# Patient Record
Sex: Female | Born: 1965 | Race: Black or African American | Hispanic: No | Smoking: Never smoker
Health system: Southern US, Community
[De-identification: ages and names within clinical notes are randomized; demographics above are authoritative.]

## PROBLEM LIST (undated history)

## (undated) DIAGNOSIS — C801 Malignant (primary) neoplasm, unspecified: Secondary | ICD-10-CM

## (undated) DIAGNOSIS — I10 Essential (primary) hypertension: Secondary | ICD-10-CM

## (undated) HISTORY — PX: COLOSTOMY: SHX63

---

## 2018-01-03 ENCOUNTER — Emergency Department (HOSPITAL_BASED_OUTPATIENT_CLINIC_OR_DEPARTMENT_OTHER)
Admission: EM | Admit: 2018-01-03 | Discharge: 2018-01-03 | Disposition: A | Discharge status: Home / Self Care | Payer: Self-pay | Attending: Emergency Medicine | Admitting: Emergency Medicine

## 2018-01-03 ENCOUNTER — Other Ambulatory Visit: Payer: Self-pay

## 2018-01-03 ENCOUNTER — Encounter (HOSPITAL_BASED_OUTPATIENT_CLINIC_OR_DEPARTMENT_OTHER): Payer: Self-pay

## 2018-01-03 DIAGNOSIS — I10 Essential (primary) hypertension: Secondary | ICD-10-CM | POA: Insufficient documentation

## 2018-01-03 DIAGNOSIS — R55 Syncope and collapse: Secondary | ICD-10-CM | POA: Insufficient documentation

## 2018-01-03 HISTORY — DX: Essential (primary) hypertension: I10

## 2018-01-03 LAB — CBC WITH DIFFERENTIAL/PLATELET
BASOS ABS: 0 10*3/uL (ref 0.0–0.1)
Basophils Relative: 1 %
Eosinophils Absolute: 0.4 10*3/uL (ref 0.0–0.7)
Eosinophils Relative: 6 %
HCT: 38.4 % (ref 36.0–46.0)
HEMOGLOBIN: 12.6 g/dL (ref 12.0–15.0)
LYMPHS ABS: 2.4 10*3/uL (ref 0.7–4.0)
LYMPHS PCT: 34 %
MCH: 27.3 pg (ref 26.0–34.0)
MCHC: 32.8 g/dL (ref 30.0–36.0)
MCV: 83.3 fL (ref 78.0–100.0)
Monocytes Absolute: 0.7 10*3/uL (ref 0.1–1.0)
Monocytes Relative: 11 %
NEUTROS PCT: 48 %
Neutro Abs: 3.4 10*3/uL (ref 1.7–7.7)
Platelets: 259 10*3/uL (ref 150–400)
RBC: 4.61 MIL/uL (ref 3.87–5.11)
RDW: 14.4 % (ref 11.5–15.5)
WBC: 7 10*3/uL (ref 4.0–10.5)

## 2018-01-03 LAB — URINALYSIS, ROUTINE W REFLEX MICROSCOPIC
BILIRUBIN URINE: NEGATIVE
GLUCOSE, UA: NEGATIVE mg/dL
HGB URINE DIPSTICK: NEGATIVE
Ketones, ur: NEGATIVE mg/dL
LEUKOCYTES UA: NEGATIVE
Nitrite: NEGATIVE
PH: 7 (ref 5.0–8.0)
Protein, ur: NEGATIVE mg/dL

## 2018-01-03 LAB — BASIC METABOLIC PANEL
ANION GAP: 10 (ref 5–15)
BUN: 13 mg/dL (ref 6–20)
CHLORIDE: 106 mmol/L (ref 98–111)
CO2: 25 mmol/L (ref 22–32)
Calcium: 9.3 mg/dL (ref 8.9–10.3)
Creatinine, Ser: 0.77 mg/dL (ref 0.44–1.00)
GFR calc Af Amer: >60 mL/min (ref >60)
GFR calc non Af Amer: >60 mL/min (ref >60)
GLUCOSE: 121 mg/dL — AB (ref 70–99)
Potassium: 3.5 mmol/L (ref 3.5–5.1)
SODIUM: 141 mmol/L (ref 135–145)

## 2018-01-03 LAB — PREGNANCY, URINE: Preg Test, Ur: NEGATIVE

## 2018-01-03 MED ORDER — SODIUM CHLORIDE 0.9 % IV BOLUS
1000.0000 mL | Freq: Once | INTRAVENOUS | Status: AC
  Administered 2018-01-03: 1000 mL via INTRAVENOUS

## 2018-01-03 MED ORDER — HYDROCHLOROTHIAZIDE 25 MG PO TABS: 25.0000 mg | ORAL_TABLET | Freq: Every day | ORAL | 0 refills | Status: AC

## 2018-01-03 NOTE — ED Provider Notes (Signed)
[facility] EMERGENCY DEPARTMENT Provider Note   CSN: [id number] Arrival date & time: 01/03/18  1305     History   Chief Complaint Chief Complaint  Patient presents with  . Near Syncope    HPI Jessica Conway is a 52 y.o. female.  52 yo F with a chief complaint of syncopal event.  The patient has been constipated and tried to have a bowel movement and then stood up and felt lightheaded.  She then collapsed in her house.  Family then decided to bring her here.  She had another event here when she tried to stand up to sit in the triage chair.  She denies chest pain or shortness of breath denies headache or neck pain she denies vomiting or diarrhea.  Denies decreased oral intake.  She denies dark stool or blood in her stool.  Denies abdominal pain.  Denies dysuria increased frequency or hesitancy.  The history is provided by the patient.  Near Syncope  This is a new problem. The current episode started less than 1 hour ago. The problem occurs rarely. The problem has been resolved. Pertinent negatives include no chest pain, no abdominal pain, no headaches and no shortness of breath. Nothing aggravates the symptoms. Nothing relieves the symptoms. She has tried nothing for the symptoms. The treatment provided no relief.    Past Medical History:  Diagnosis Date  . Hypertension     There are no active problems to display for this patient.   Past Surgical History:  Procedure Laterality Date  . CESAREAN SECTION       OB History   None      Home Medications    Prior to Admission medications   Medication Sig Start Date End Date Taking? Authorizing Provider  hydrochlorothiazide (HYDRODIURIL) 25 MG tablet Take 1 tablet (25 mg total) by mouth daily. 01/03/18   Deno Etienne, DO    Family History No family history on file.  Social History Social History   Tobacco Use  . Smoking status: Never Smoker  . Smokeless tobacco: Never Used  Substance Use Topics  . Alcohol  use: Never    Frequency: Never  . Drug use: Never     Allergies   Patient has no known allergies.   Review of Systems Review of Systems  Constitutional: Negative for chills and fever.  HENT: Negative for congestion and rhinorrhea.   Eyes: Negative for redness and visual disturbance.  Respiratory: Negative for shortness of breath and wheezing.   Cardiovascular: Positive for near-syncope. Negative for chest pain and palpitations.  Gastrointestinal: Negative for abdominal pain, nausea and vomiting.  Genitourinary: Negative for dysuria and urgency.  Musculoskeletal: Negative for arthralgias and myalgias.  Skin: Negative for pallor and wound.  Neurological: Negative for dizziness and headaches.     Physical Exam Updated Vital Signs BP (!) 176/107 (BP Location: Right Arm) Comment: out of meds x 1 month  Pulse (!) 110   Temp 98.5 F (36.9 C) (Oral)   Resp 20   SpO2 100%   Physical Exam  Constitutional: She is oriented to person, place, and time. She appears well-developed and well-nourished. No distress.  HENT:  Head: Normocephalic and atraumatic.  Eyes: Pupils are equal, round, and reactive to light. EOM are normal.  Neck: Normal range of motion. Neck supple.  Cardiovascular: Normal rate and regular rhythm. Exam reveals no gallop and no friction rub.  No murmur heard. Pulmonary/Chest: Effort normal. She has no wheezes. She has no rales.  Abdominal:  Soft. She exhibits no distension. There is no tenderness.  Musculoskeletal: She exhibits no edema or tenderness.  Neurological: She is alert and oriented to person, place, and time. She has normal strength. No cranial nerve deficit or sensory deficit. She displays a negative Romberg sign. Coordination and gait normal. GCS eye subscore is 4. GCS verbal subscore is 5. GCS motor subscore is 6.  Skin: Skin is warm and dry. She is not diaphoretic.  Psychiatric: She has a normal mood and affect. Her behavior is normal.  Nursing note and  vitals reviewed.    ED Treatments / Results  Labs (all labs ordered are listed, but only abnormal results are displayed) Labs Reviewed  BASIC METABOLIC PANEL - Abnormal; Notable for the following components:      Result Value   Glucose, Bld 121 (*)    All other components within normal limits  URINALYSIS, ROUTINE W REFLEX MICROSCOPIC - Abnormal; Notable for the following components:   Color, Urine STRAW (*)    Specific Gravity, Urine <1.005 (*)    All other components within normal limits  CBC WITH DIFFERENTIAL/PLATELET  PREGNANCY, URINE    EKG EKG Interpretation  Date/Time:  Monday January 03 2018 13:09:59 EDT Ventricular Rate:  110 PR Interval:  158 QRS Duration: 98 QT Interval:  338 QTC Calculation: 457 R Axis:   -39 Text Interpretation:  Sinus tachycardia Left axis deviation Left ventricular hypertrophy with repolarization abnormality Abnormal ECG no wpw, prolonged qt or brugada No significant change since last tracing Confirmed by Deno Etienne [phone]) on 01/03/2018 1:42:12 PM   Radiology No results found.  Procedures Procedures (including critical care time)  Medications Ordered in ED Medications  sodium chloride 0.9 % bolus 1,000 mL (0 mLs Intravenous Stopped 01/03/18 1435)     Initial Impression / Assessment and Plan / ED Course  I have reviewed the triage vital signs and the nursing notes.  Pertinent labs & imaging results that were available during my care of the patient were reviewed by me and considered in my medical decision making (see chart for details).     52 yo F with a cc of syncope.  This occurred twice a day.  She is denying any other symptoms.  She is tachycardic on arrival here.  We will give a fluid bolus check lab work.  We will then reassess.  She feels better laying in the bed.  I wonder if this is orthostatic.  It sounded vasovagal based on her history of trying to have a bowel movement and then the initial event.  I am not sure why she had a  recurrent event here.  Lab work is unremarkable the patient is not anemic she is not pregnant she has no concerning electrolyte abnormality.  She feels better and continues to feel better.  She is able to ambulate without difficulty.  Discharge home.  3:17 PM:  I have discussed the diagnosis/risks/treatment options with the patient and family and believe the pt to be eligible for discharge home to follow-up with PCP. We also discussed returning to the ED immediately if new or worsening sx occur. We discussed the sx which are most concerning (e.g., sudden worsening pain, fever, inability to tolerate by mouth) that necessitate immediate return. Medications administered to the patient during their visit and any new prescriptions provided to the patient are listed below.  Medications given during this visit Medications  sodium chloride 0.9 % bolus 1,000 mL (0 mLs Intravenous Stopped 01/03/18 1435)  The patient appears reasonably screen and/or stabilized for discharge and I doubt any other medical condition or other [facility] requiring further screening, evaluation, or treatment in the ED at this time prior to discharge.    Final Clinical Impressions(s) / ED Diagnoses   Final diagnoses:  Syncope and collapse    ED Discharge Orders         Ordered    hydrochlorothiazide (HYDRODIURIL) 25 MG tablet  Daily     01/03/18 Auberry, Tonae Livolsi, DO 01/03/18 1517

## 2018-01-03 NOTE — Discharge Instructions (Signed)
Eat and drink well for the next couple of days.  Return for chest pain, shortness of breath, abdominal pain, vomiting, repeat event. Follow up with your doctor.

## 2018-01-03 NOTE — ED Triage Notes (Signed)
Was called to ED lobby by reg clerk-arrived to find pt lying in her right side-A/O-was assisted to sitting then standing and to w/c-pt states she had a near syncopal episode at home earlier-denies LOC-states "I just didn't feel good and felt like I was gonna go down"-denies pain-states she was concerned her BP elevated due to out of meds x 1 month

## 2019-02-26 ENCOUNTER — Encounter (HOSPITAL_BASED_OUTPATIENT_CLINIC_OR_DEPARTMENT_OTHER): Payer: Self-pay | Admitting: Emergency Medicine

## 2019-02-26 ENCOUNTER — Emergency Department (HOSPITAL_BASED_OUTPATIENT_CLINIC_OR_DEPARTMENT_OTHER): Payer: Self-pay

## 2019-02-26 ENCOUNTER — Other Ambulatory Visit: Payer: Self-pay

## 2019-02-26 ENCOUNTER — Emergency Department (HOSPITAL_BASED_OUTPATIENT_CLINIC_OR_DEPARTMENT_OTHER)
Admission: EM | Admit: 2019-02-26 | Discharge: 2019-02-26 | Disposition: A | Discharge status: Home / Self Care | Payer: Self-pay | Attending: Emergency Medicine | Admitting: Emergency Medicine

## 2019-02-26 DIAGNOSIS — E876 Hypokalemia: Secondary | ICD-10-CM | POA: Insufficient documentation

## 2019-02-26 DIAGNOSIS — I10 Essential (primary) hypertension: Secondary | ICD-10-CM | POA: Insufficient documentation

## 2019-02-26 DIAGNOSIS — G8918 Other acute postprocedural pain: Secondary | ICD-10-CM | POA: Insufficient documentation

## 2019-02-26 HISTORY — DX: Malignant (primary) neoplasm, unspecified: C80.1

## 2019-02-26 LAB — COMPREHENSIVE METABOLIC PANEL
ALT: 12 U/L (ref 0–44)
AST: 18 U/L (ref 15–41)
Albumin: 3.3 g/dL — ABNORMAL LOW (ref 3.5–5.0)
Alkaline Phosphatase: 64 U/L (ref 38–126)
Anion gap: 10 (ref 5–15)
BUN: 9 mg/dL (ref 6–20)
CO2: 25 mmol/L (ref 22–32)
Calcium: 9.1 mg/dL (ref 8.9–10.3)
Chloride: 102 mmol/L (ref 98–111)
Creatinine, Ser: 0.89 mg/dL (ref 0.44–1.00)
GFR calc Af Amer: >60 mL/min (ref >60)
GFR calc non Af Amer: >60 mL/min (ref >60)
Glucose, Bld: 121 mg/dL — ABNORMAL HIGH (ref 70–99)
Potassium: 3.1 mmol/L — ABNORMAL LOW (ref 3.5–5.1)
Sodium: 137 mmol/L (ref 135–145)
Total Bilirubin: 0.6 mg/dL (ref 0.3–1.2)
Total Protein: 7.4 g/dL (ref 6.5–8.1)

## 2019-02-26 LAB — CBC WITH DIFFERENTIAL/PLATELET
Abs Immature Granulocytes: 0.04 10*3/uL (ref 0.00–0.07)
Basophils Absolute: 0.1 10*3/uL (ref 0.0–0.1)
Basophils Relative: 1 %
Eosinophils Absolute: 0.1 10*3/uL (ref 0.0–0.5)
Eosinophils Relative: 2 %
HCT: 32.3 % — ABNORMAL LOW (ref 36.0–46.0)
Hemoglobin: 9.6 g/dL — ABNORMAL LOW (ref 12.0–15.0)
Immature Granulocytes: 1 %
Lymphocytes Relative: 18 %
Lymphs Abs: 1.3 10*3/uL (ref 0.7–4.0)
MCH: 25.9 pg — ABNORMAL LOW (ref 26.0–34.0)
MCHC: 29.7 g/dL — ABNORMAL LOW (ref 30.0–36.0)
MCV: 87.1 fL (ref 80.0–100.0)
Monocytes Absolute: 0.8 10*3/uL (ref 0.1–1.0)
Monocytes Relative: 11 %
Neutro Abs: 5.1 10*3/uL (ref 1.7–7.7)
Neutrophils Relative %: 67 %
Platelets: 545 10*3/uL — ABNORMAL HIGH (ref 150–400)
RBC: 3.71 MIL/uL — ABNORMAL LOW (ref 3.87–5.11)
RDW: 15.6 % — ABNORMAL HIGH (ref 11.5–15.5)
WBC: 7.5 10*3/uL (ref 4.0–10.5)
nRBC: 0 % (ref 0.0–0.2)

## 2019-02-26 LAB — LACTIC ACID, PLASMA: Lactic Acid, Venous: 1.7 mmol/L (ref 0.5–1.9)

## 2019-02-26 MED ORDER — SODIUM CHLORIDE 0.9 % IV BOLUS
1000.0000 mL | Freq: Once | INTRAVENOUS | Status: AC
  Administered 2019-02-26: 15:00:00 1000 mL via INTRAVENOUS

## 2019-02-26 MED ORDER — POTASSIUM CHLORIDE CRYS ER 20 MEQ PO TBCR: 20.0000 meq | EXTENDED_RELEASE_TABLET | Freq: Two times a day (BID) | ORAL | 0 refills | Status: AC

## 2019-02-26 MED ORDER — HYDROMORPHONE HCL 1 MG/ML IJ SOLN
1.0000 mg | Freq: Once | INTRAMUSCULAR | Status: AC
  Administered 2019-02-26: 1 mg via INTRAVENOUS
  Filled 2019-02-26: qty 1

## 2019-02-26 MED ORDER — ONDANSETRON HCL 4 MG/2ML IJ SOLN
4.0000 mg | Freq: Once | INTRAMUSCULAR | Status: AC
  Administered 2019-02-26: 4 mg via INTRAVENOUS
  Filled 2019-02-26: qty 2

## 2019-02-26 MED ORDER — IOHEXOL 300 MG/ML  SOLN
100.0000 mL | Freq: Once | INTRAMUSCULAR | Status: AC | PRN
  Administered 2019-02-26: 100 mL via INTRAVENOUS

## 2019-02-26 NOTE — ED Triage Notes (Signed)
Generalized abd pain today with nausea. Recently had rectal surgery for cancer, has colostomy in place.

## 2019-02-26 NOTE — ED Provider Notes (Signed)
[place] EMERGENCY DEPARTMENT Provider Note   CSN: [id number] Arrival date & time: 02/26/19  1422     History   Chief Complaint Chief Complaint  Patient presents with   Abdominal Pain    HPI Jessica Conway is a 53 y.o. female.     Patient with history of anorectal cancer, surgery and hospitalization for this 10/6-10/12/20 at [facility].  Patient reports continued pain in her abdomen since the surgery.  She is on pain medication for this at home.  Her pain was uncontrolled today prompting emergency department visit.  She continues to have throughput into her colostomy without any noted blood.  No fevers, chest pain, shortness of breath.  No urinary symptoms.  She reports decreased appetite and solid and fluid intake.  The onset of this condition was acute. The course is constant. Aggravating factors: none. Alleviating factors: none.       Past Medical History:  Diagnosis Date   Cancer ([place])    Hypertension     There are no active problems to display for this patient.   Past Surgical History:  Procedure Laterality Date   CESAREAN SECTION     COLOSTOMY       OB History   No obstetric history on file.      Home Medications    Prior to Admission medications   Medication Sig Start Date End Date Taking? Authorizing Provider  hydrochlorothiazide (HYDRODIURIL) 25 MG tablet Take 1 tablet (25 mg total) by mouth daily. 01/03/18   Deno Etienne, DO    Family History No family history on file.  Social History Social History   Tobacco Use   Smoking status: Never Smoker   Smokeless tobacco: Never Used  Substance Use Topics   Alcohol use: Never    Frequency: Never   Drug use: Never     Allergies   Patient has no known allergies.   Review of Systems Review of Systems  Constitutional: Negative for fever.  HENT: Negative for rhinorrhea and sore throat.   Eyes: Negative for redness.  Respiratory: Negative for cough.     Cardiovascular: Negative for chest pain.  Gastrointestinal: Positive for abdominal pain. Negative for constipation, diarrhea, nausea and vomiting.  Genitourinary: Negative for dysuria.  Musculoskeletal: Negative for myalgias.  Skin: Negative for rash.  Neurological: Negative for headaches.     Physical Exam Updated Vital Signs BP 103/71    Pulse 96    Temp 98.5 F (36.9 C) (Oral)    Resp (!) 22    Ht 5\' 2"  (1.575 m)    Wt 61.7 kg    SpO2 100%    BMI 24.87 kg/m   Physical Exam Vitals signs and nursing note reviewed.  Constitutional:      Appearance: She is well-developed.  HENT:     Head: Normocephalic and atraumatic.  Eyes:     General:        Right eye: No discharge.        Left eye: No discharge.     Conjunctiva/sclera: Conjunctivae normal.  Neck:     Musculoskeletal: Normal range of motion and neck supple.  Cardiovascular:     Rate and Rhythm: Normal rate and regular rhythm.     Heart sounds: Normal heart sounds.  Pulmonary:     Effort: Pulmonary effort is normal.     Breath sounds: Normal breath sounds.  Abdominal:     Palpations: Abdomen is soft.     Tenderness: There is generalized abdominal  tenderness. There is no right CVA tenderness, left CVA tenderness, guarding or rebound.  Genitourinary:    Comments: Patient with 2 post surgical drains in place, drainage noted in bulbs. Skin:    General: Skin is warm and dry.  Neurological:     Mental Status: She is alert.      ED Treatments / Results  Labs (all labs ordered are listed, but only abnormal results are displayed) Labs Reviewed  COMPREHENSIVE METABOLIC PANEL - Abnormal; Notable for the following components:      Result Value   Potassium 3.1 (*)    Glucose, Bld 121 (*)    Albumin 3.3 (*)    All other components within normal limits  CBC WITH DIFFERENTIAL/PLATELET - Abnormal; Notable for the following components:   RBC 3.71 (*)    Hemoglobin 9.6 (*)    HCT 32.3 (*)    MCH 25.9 (*)    MCHC 29.7 (*)     RDW 15.6 (*)    Platelets 545 (*)    All other components within normal limits  LACTIC ACID, PLASMA    EKG None  Radiology Ct Abdomen Pelvis W Contrast  Result Date: 02/26/2019 CLINICAL DATA:  Reported history of surgical management of anal cancer earlier this month with colostomy. Acute abdominal pain. EXAM: CT ABDOMEN AND PELVIS WITH CONTRAST TECHNIQUE: Multidetector CT imaging of the abdomen and pelvis was performed using the standard protocol following bolus administration of intravenous contrast. CONTRAST:  127mL OMNIPAQUE IOHEXOL 300 MG/ML  SOLN COMPARISON:  01/25/2019 PET-CT.  05/02/2018 MRI pelvis. FINDINGS: Lower chest: No acute abnormality at the lung bases. Calcified granulomatous nodes again noted in the left infrahilar and right pericardiophrenic regions. Hepatobiliary: Normal liver size. No liver mass. Cholelithiasis. No gallbladder distention. No gallbladder wall thickening. No pericholecystic fluid. No biliary ductal dilatation. Pancreas: Normal, with no mass or duct dilation. Spleen: Normal size. No mass. Adrenals/Urinary Tract: Normal adrenals. Normal kidneys with no hydronephrosis and no renal mass. Normal bladder. Stomach/Bowel: Normal non-distended stomach. No dilated small bowel loops. Suggestion of mild small bowel wall thickening within several pelvic small bowel loops. Appendix not discretely visualized. Status post interval low anterior resection with end sigmoid colostomy in the ventral left abdominal wall. Remnant large bowel is collapsed with no definite large bowel wall thickening. No colonic diverticulosis. Fat stranding and ill-defined fluid in the LAR surgical bed extending into the lower presacral space. Two surgical drains terminate within LAR surgical bed. No fluid collections or discrete masses in the LAR surgical bed. Vascular/Lymphatic: Atherosclerotic nonaneurysmal abdominal aorta. Patent portal, splenic, hepatic and renal veins. No pathologically enlarged  lymph nodes in the abdomen or pelvis. Reproductive: Normal size anteverted uterus. Tiny coarse calcification in the anterior fundal midline uterus may represent a tiny calcified degenerated fibroid. No adnexal masses. Other: No pneumoperitoneum, ascites or focal fluid collection. Musculoskeletal: No aggressive appearing focal osseous lesions. Mild thoracolumbar spondylosis. IMPRESSION: 1. Status post LAR and end colostomy. Fat stranding and ill-defined fluid in the LAR surgical bed extending into the lower presacral space, probably within expected postoperative limits, without abscess or discrete mass in the surgical bed. 2. Suggestion of mild wall thickening within several pelvic small bowel loops. If the patient has a history of pelvic radiation therapy, findings may represent post treatment change. Otherwise, mild nonspecific ileitis not excluded. No evidence of bowel obstruction. 3. No findings of metastatic disease in the abdomen or pelvis. 4. Chronic findings include: Aortic Atherosclerosis (ICD10-I70.0). Cholelithiasis. Electronically Signed   By: Rinaldo Ratel  Poff M.D.   On: 02/26/2019 16:28    Procedures Procedures (including critical care time)  Medications Ordered in ED Medications  sodium chloride 0.9 % bolus 1,000 mL (1,000 mLs Intravenous New Bag/Given 02/26/19 1459)  HYDROmorphone (DILAUDID) injection 1 mg (1 mg Intravenous Given 02/26/19 1500)  ondansetron (ZOFRAN) injection 4 mg (4 mg Intravenous Given 02/26/19 1500)  iohexol (OMNIPAQUE) 300 MG/ML solution 100 mL (100 mLs Intravenous Contrast Given 02/26/19 1542)     Initial Impression / Assessment and Plan / ED Course  I have reviewed the triage vital signs and the nursing notes.  Pertinent labs & imaging results that were available during my care of the patient were reviewed by me and considered in my medical decision making (see chart for details).        Patient seen and examined.  Will control pain, check labs.  Discussed  with Dr. Willaim Rayas who will see patient.  CT of the abdomen ordered to evaluate for any postop complications.  Normal white blood cell count noted.  Vital signs reviewed and are as follows: BP 103/71    Pulse 96    Temp 98.5 F (36.9 C) (Oral)    Resp (!) 22    Ht 5\' 2"  (1.575 m)    Wt 61.7 kg    SpO2 100%    BMI 24.87 kg/m   4:10 PM Pain controlled. Reviewed CT. Awaiting read.   5:33 PM patient updated on results.  She continues to appear much more comfortable lying in bed and states that she feels okay.  We discussed her results at bedside.  She states that she has pain medicine to use at home and has follow-up with her doctors tomorrow.  She is comfortable with discharged home.  Family member at bedside.  The patient was urged to return to the Emergency Department immediately with worsening of current symptoms, worsening abdominal pain, persistent vomiting, blood noted in stools, fever, or any other concerns. The patient verbalized understanding.    Final Clinical Impressions(s) / ED Diagnoses   Final diagnoses:  Postoperative pain  Hypokalemia   Patient with abdominal pain approximately 2 to 3 weeks postop.  Lab work is reassuring in regards to infection.  Mild hypokalemia, patient is on HCTZ.  Rx for potassium given.  CT with expected postoperative changes.  No signs of abscess or obstruction.  No obvious concerning areas for other infection which is in line with her blood work today.  Main issue seems to be pain control at this time.  Pain is well controlled in the emergency department with parenteral narcotic.  She continues to do well.  She has medicine to use at home and will continue this.  Follow-up with her doctor tomorrow.   ED Discharge Orders         Ordered    potassium chloride SA (KLOR-CON) 20 MEQ tablet  2 times daily     02/26/19 1724           Carlisle Cater, [place] 02/26/19 1735    Margette Fast, MD 02/27/19 1013

## 2019-02-26 NOTE — ED Notes (Signed)
Rectal surgical site with intact sutures no redness or exudate noted.Two JP drain insertion sites to bilateral buttock with clean, dry, intact areas. Abd pain on presentation, but now resolved after pain medication administration.

## 2019-02-26 NOTE — Discharge Instructions (Signed)
Please read and follow all provided instructions.  Your diagnoses today include:  1. Postoperative pain   2. Hypokalemia     Tests performed today include:  Blood counts and electrolytes -slightly low potassium, please have this rechecked by your doctor  Blood tests to check liver and kidney function  CT scan of your abdomen -shows inflammation and some swelling as expected after your surgery.  Fortunately, no signs of infection or other concerning complications.  Vital signs. See below for your results today.   Medications prescribed:   Potassium supplement  Take any prescribed medications only as directed.  Home care instructions:   Follow any educational materials contained in this packet.  Follow-up instructions: Please follow-up with your doctor tomorrow as planned.    Return instructions:  SEEK IMMEDIATE MEDICAL ATTENTION IF:  The pain does not go away or becomes severe   A temperature above 101F develops   Repeated vomiting occurs (multiple episodes)   The pain becomes localized to portions of the abdomen. The right side could possibly be appendicitis. In an adult, the left lower portion of the abdomen could be colitis or diverticulitis.   Blood is being passed in stools or vomit (bright red or black tarry stools)   You develop chest pain, difficulty breathing, dizziness or fainting, or become confused, poorly responsive, or inconsolable (young children)  If you have any other emergent concerns regarding your health  Additional Information: Abdominal (belly) pain can be caused by many things. Your caregiver performed an examination and possibly ordered blood/urine tests and imaging (CT scan, x-rays, ultrasound). Many cases can be observed and treated at home after initial evaluation in the emergency department. Even though you are being discharged home, abdominal pain can be unpredictable. Therefore, you need a repeated exam if your pain does not resolve,  returns, or worsens. Most patients with abdominal pain don't have to be admitted to the hospital or have surgery, but serious problems like appendicitis and gallbladder attacks can start out as nonspecific pain. Many abdominal conditions cannot be diagnosed in one visit, so follow-up evaluations are very important.  Your vital signs today were: BP 112/66    Pulse 89    Temp 98.5 F (36.9 C) (Oral)    Resp 13    Ht 5\' 2"  (1.575 m)    Wt 61.7 kg    SpO2 98%    BMI 24.87 kg/m  If your blood pressure (bp) was elevated above 135/85 this visit, please have this repeated by your doctor within one month. --------------

## 2020-08-31 IMAGING — CT CT ABD-PELV W/ CM
2 of 5 series · 15 of 46 positions shown, 17 images · IV contrast (Omnipaque)
Comparison: 01/25/2019 PET-CT.  05/02/2018 MRI pelvis.

CLINICAL DATA: Reported history of surgical management of anal
cancer earlier this month with colostomy. Acute abdominal pain.

EXAM:
CT ABDOMEN AND PELVIS WITH CONTRAST
TECHNIQUE: Multidetector CT imaging of the abdomen and pelvis was performed
using the standard protocol following bolus administration of
intravenous contrast.
CONTRAST:  100mL OMNIPAQUE IOHEXOL 300 MG/ML  SOLN

[Series 2: axial st · axial · 0.66mm/px · z∈[-304,+111]mm · 12 of 93 slices shown, 14 images]
[im 5/93  soft-tissue]
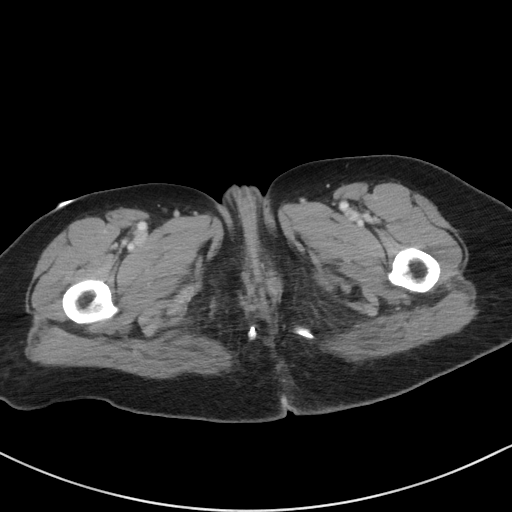
[im 5/93  bone]
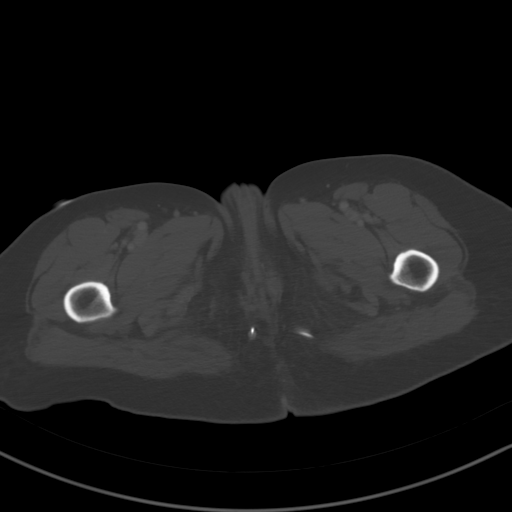
[im 14/93  soft-tissue]
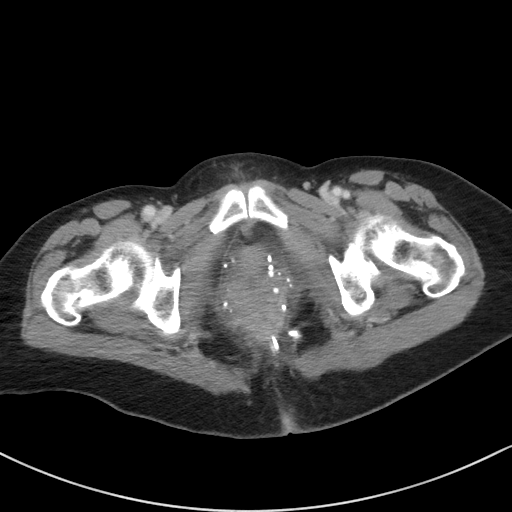
[im 19/93  soft-tissue]
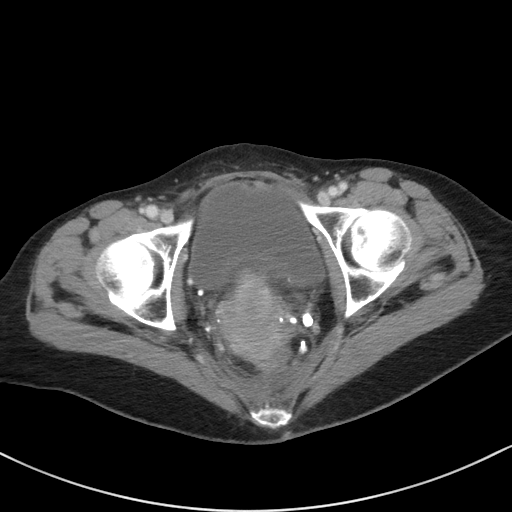
[im 28/93  soft-tissue]
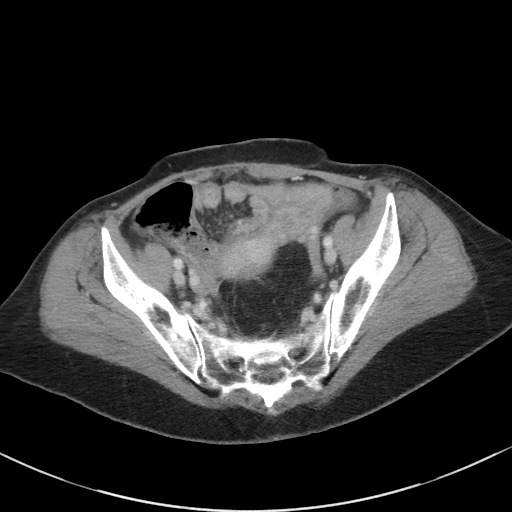
[im 37/93  soft-tissue]
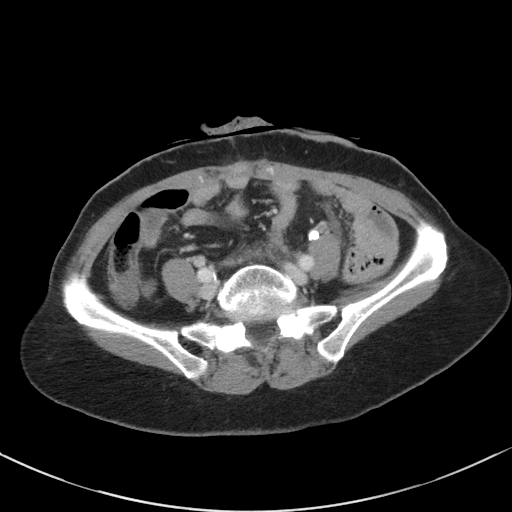
[im 42/93  soft-tissue]
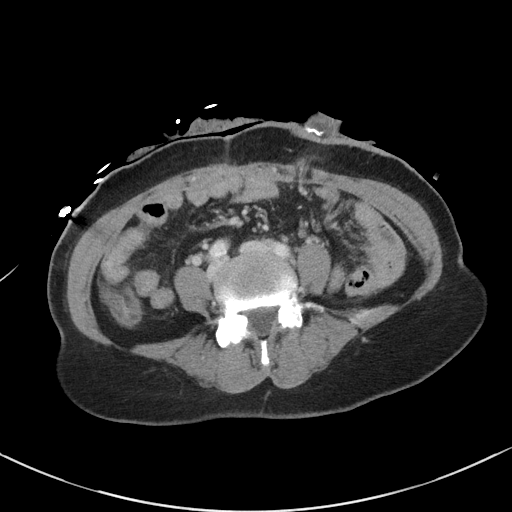
[im 51/93  soft-tissue]
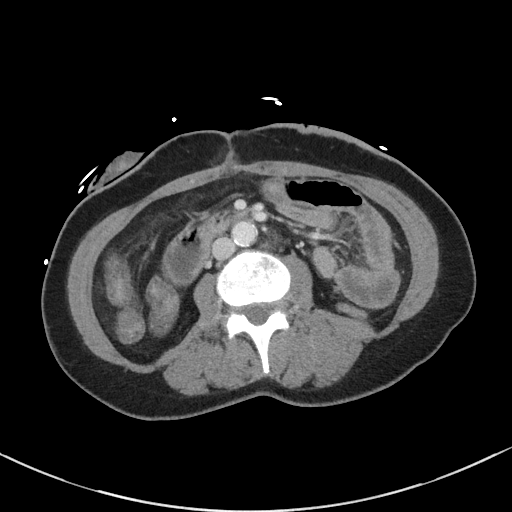
[im 56/93  soft-tissue]
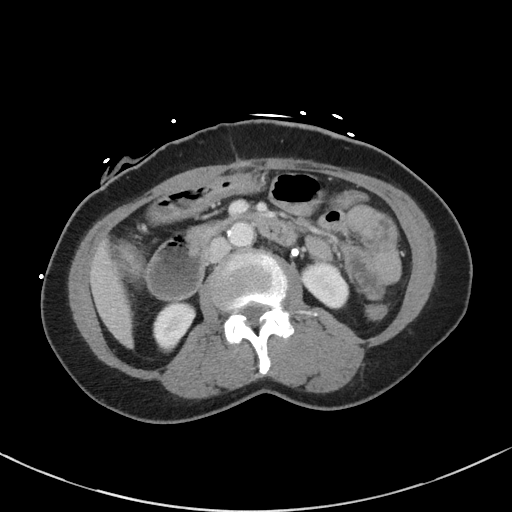
[im 65/93  soft-tissue]
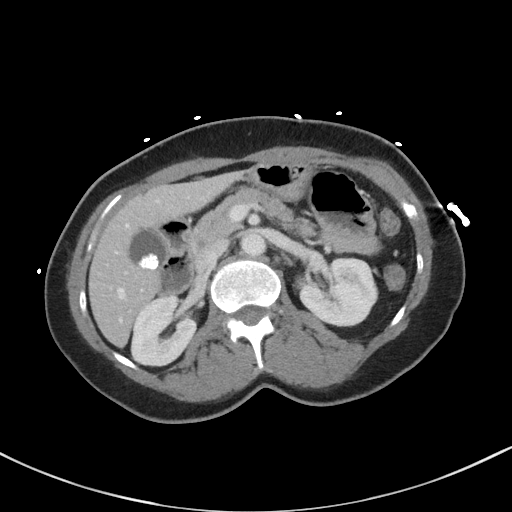
[im 65/93  bone]
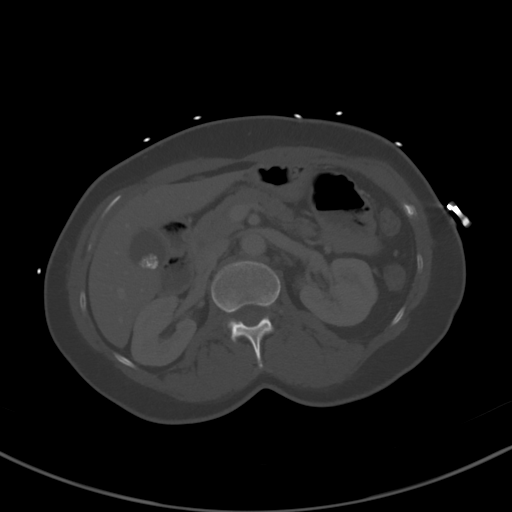
[im 74/93  soft-tissue]
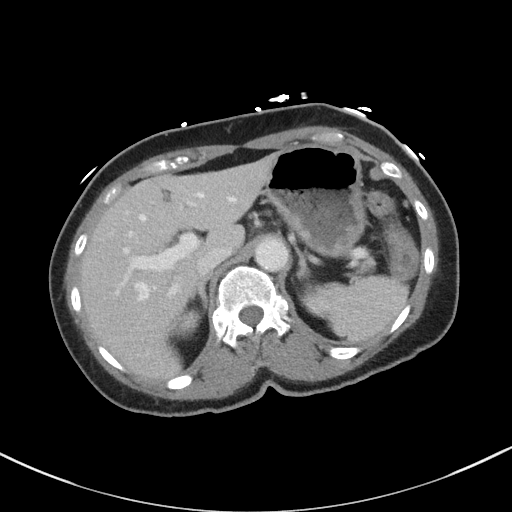
[im 79/93  soft-tissue]
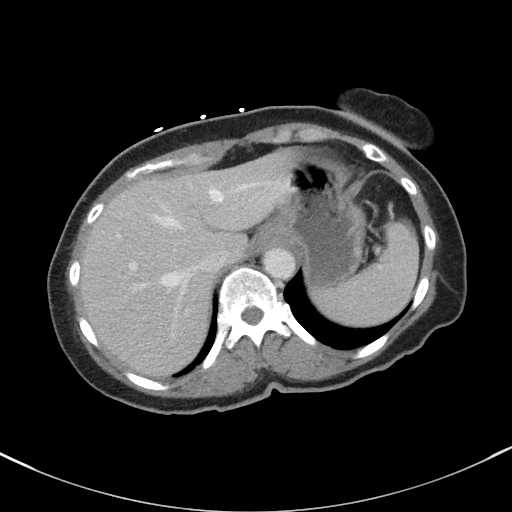
[im 88/93  soft-tissue]
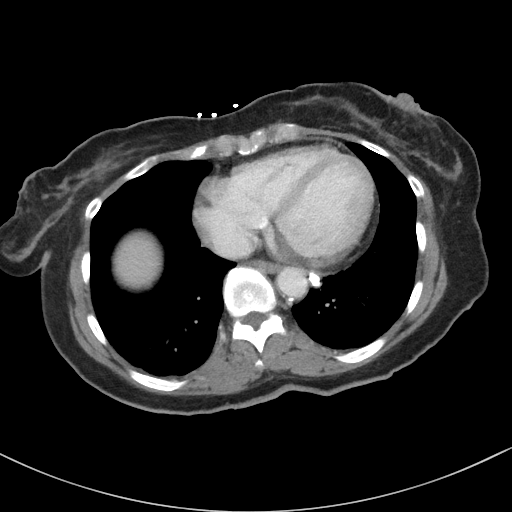

[Series 5: coronal st · coronal · 0.69mm/px · 3 of 74 slices shown]
[im 25/74  soft-tissue]
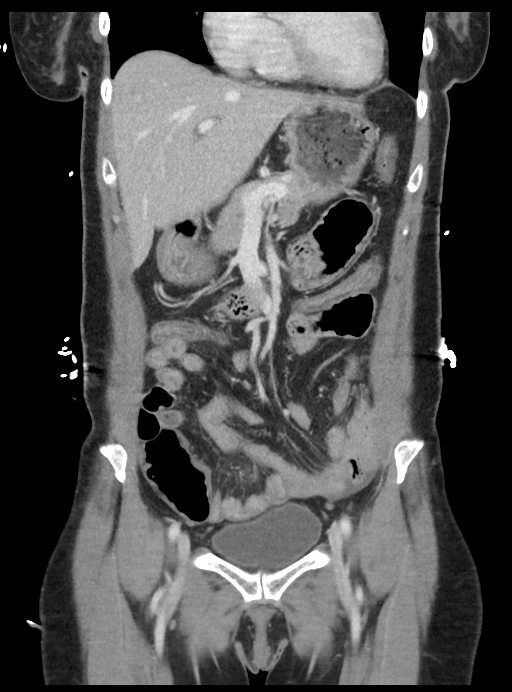
[im 33/74  soft-tissue]
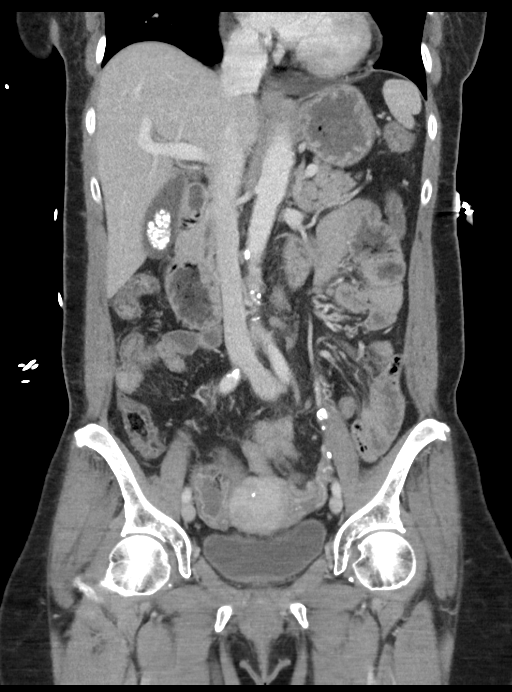
[im 41/74  soft-tissue]
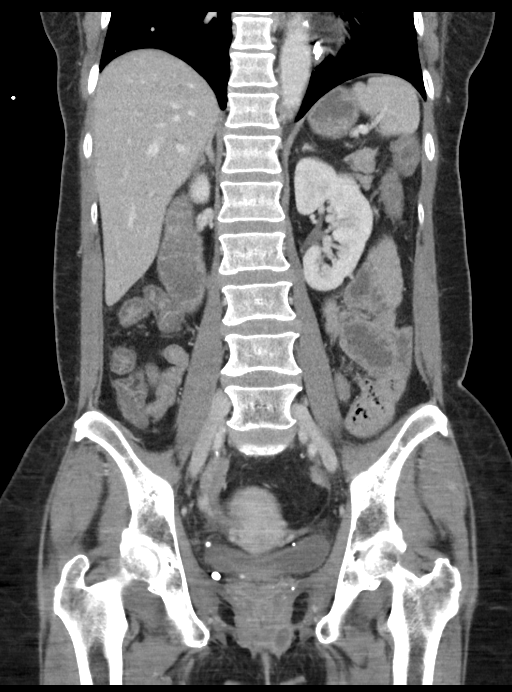

[15 of 46 positions shown; findings below may reference images not displayed]

FINDINGS: Lower chest: No acute abnormality at the lung bases. Calcified
granulomatous nodes again noted in the left infrahilar and right
pericardiophrenic regions.

Hepatobiliary: Normal liver size. No liver mass. Cholelithiasis. No
gallbladder distention. No gallbladder wall thickening. No
pericholecystic fluid. No biliary ductal dilatation.

Pancreas: Normal, with no mass or duct dilation.

Spleen: Normal size. No mass.

Adrenals/Urinary Tract: Normal adrenals. Normal kidneys with no
hydronephrosis and no renal mass. Normal bladder.

Stomach/Bowel: Normal non-distended stomach. No dilated small bowel
loops. Suggestion of mild small bowel wall thickening within several
pelvic small bowel loops. Appendix not discretely visualized. Status
post interval low anterior resection with end sigmoid colostomy in
the ventral left abdominal wall. Remnant large bowel is collapsed
with no definite large bowel wall thickening. No colonic
diverticulosis. Fat stranding and ill-defined fluid in the [REDACTED] bed extending into the lower presacral space. Two surgical
drains terminate within [REDACTED] bed. No fluid collections or
discrete masses in the [REDACTED] bed.

Vascular/Lymphatic: Atherosclerotic nonaneurysmal abdominal aorta.
Patent portal, splenic, hepatic and renal veins. No pathologically
enlarged lymph nodes in the abdomen or pelvis.

Reproductive: Normal size anteverted uterus. Tiny coarse
calcification in the anterior fundal midline uterus may represent a
tiny calcified degenerated fibroid. No adnexal masses.

Other: No pneumoperitoneum, ascites or focal fluid collection.

Musculoskeletal: No aggressive appearing focal osseous lesions. Mild
thoracolumbar spondylosis.
IMPRESSION: 1. Status CRES and end colostomy. Fat stranding and ill-defined
fluid in the [REDACTED] bed extending into the lower presacral
space, probably within expected postoperative limits, without
abscess or discrete mass in the surgical bed.
2. Suggestion of mild wall thickening within several pelvic small
bowel loops. If the patient has a history of pelvic radiation
therapy, findings may represent post treatment change. Otherwise,
mild nonspecific ileitis not excluded. No evidence of bowel
obstruction.
3. No findings of metastatic disease in the abdomen or pelvis.
4. Chronic findings include: Aortic Atherosclerosis ([id number]-GQG.G).
Cholelithiasis.

## 2023-03-26 ENCOUNTER — Emergency Department (HOSPITAL_BASED_OUTPATIENT_CLINIC_OR_DEPARTMENT_OTHER): Payer: Medicare HMO

## 2023-03-26 ENCOUNTER — Emergency Department (HOSPITAL_BASED_OUTPATIENT_CLINIC_OR_DEPARTMENT_OTHER)
Admission: EM | Admit: 2023-03-26 | Discharge: 2023-03-26 | Disposition: A | Discharge status: Home / Self Care | Payer: Medicare HMO | Attending: Emergency Medicine | Admitting: Emergency Medicine

## 2023-03-26 ENCOUNTER — Other Ambulatory Visit: Payer: Self-pay

## 2023-03-26 ENCOUNTER — Encounter (HOSPITAL_BASED_OUTPATIENT_CLINIC_OR_DEPARTMENT_OTHER): Payer: Self-pay | Admitting: Emergency Medicine

## 2023-03-26 DIAGNOSIS — R059 Cough, unspecified: Secondary | ICD-10-CM | POA: Insufficient documentation

## 2023-03-26 DIAGNOSIS — R55 Syncope and collapse: Secondary | ICD-10-CM | POA: Insufficient documentation

## 2023-03-26 DIAGNOSIS — R0689 Other abnormalities of breathing: Secondary | ICD-10-CM | POA: Insufficient documentation

## 2023-03-26 DIAGNOSIS — M25572 Pain in left ankle and joints of left foot: Secondary | ICD-10-CM | POA: Insufficient documentation

## 2023-03-26 DIAGNOSIS — W01198A Fall on same level from slipping, tripping and stumbling with subsequent striking against other object, initial encounter: Secondary | ICD-10-CM | POA: Insufficient documentation

## 2023-03-26 LAB — CBC WITH DIFFERENTIAL/PLATELET
Abs Immature Granulocytes: 0.04 10*3/uL (ref 0.00–0.07)
Basophils Absolute: 0 10*3/uL (ref 0.0–0.1)
Basophils Relative: 1 %
Eosinophils Absolute: 0.1 10*3/uL (ref 0.0–0.5)
Eosinophils Relative: 1 %
HCT: 39.7 % (ref 36.0–46.0)
Hemoglobin: 13.1 g/dL (ref 12.0–15.0)
Immature Granulocytes: 1 %
Lymphocytes Relative: 22 %
Lymphs Abs: 1.7 10*3/uL (ref 0.7–4.0)
MCH: 27 pg (ref 26.0–34.0)
MCHC: 33 g/dL (ref 30.0–36.0)
MCV: 81.9 fL (ref 80.0–100.0)
Monocytes Absolute: 0.7 10*3/uL (ref 0.1–1.0)
Monocytes Relative: 9 %
Neutro Abs: 5.3 10*3/uL (ref 1.7–7.7)
Neutrophils Relative %: 66 %
Platelets: 263 10*3/uL (ref 150–400)
RBC: 4.85 MIL/uL (ref 3.87–5.11)
RDW: 14.6 % (ref 11.5–15.5)
WBC: 7.8 10*3/uL (ref 4.0–10.5)
nRBC: 0 % (ref 0.0–0.2)

## 2023-03-26 LAB — BASIC METABOLIC PANEL
Anion gap: 9 (ref 5–15)
BUN: 15 mg/dL (ref 6–20)
CO2: 23 mmol/L (ref 22–32)
Calcium: 9.3 mg/dL (ref 8.9–10.3)
Chloride: 104 mmol/L (ref 98–111)
Creatinine, Ser: 0.9 mg/dL (ref 0.44–1.00)
GFR, Estimated: >60 mL/min (ref >60)
Glucose, Bld: 109 mg/dL — ABNORMAL HIGH (ref 70–99)
Potassium: 3.2 mmol/L — ABNORMAL LOW (ref 3.5–5.1)
Sodium: 136 mmol/L (ref 135–145)

## 2023-03-26 LAB — HEPATIC FUNCTION PANEL
ALT: 16 U/L (ref 0–44)
AST: 22 U/L (ref 15–41)
Albumin: 4 g/dL (ref 3.5–5.0)
Alkaline Phosphatase: 82 U/L (ref 38–126)
Bilirubin, Direct: <0.1 mg/dL (ref 0.0–0.2)
Total Bilirubin: 0.5 mg/dL (ref <1.2)
Total Protein: 7.9 g/dL (ref 6.5–8.1)

## 2023-03-26 LAB — BRAIN NATRIURETIC PEPTIDE: B Natriuretic Peptide: 15.1 pg/mL (ref 0.0–100.0)

## 2023-03-26 LAB — TROPONIN I (HIGH SENSITIVITY): Troponin I (High Sensitivity): 3 ng/L (ref <18)

## 2023-03-26 LAB — D-DIMER, QUANTITATIVE: D-Dimer, Quant: 2.87 ug{FEU}/mL — ABNORMAL HIGH (ref 0.00–0.50)

## 2023-03-26 MED ORDER — OXYCODONE HCL 5 MG PO TABS
5.0000 mg | ORAL_TABLET | Freq: Once | ORAL | Status: AC
  Administered 2023-03-26: 5 mg via ORAL
  Filled 2023-03-26: qty 1

## 2023-03-26 MED ORDER — IOHEXOL 350 MG/ML SOLN
75.0000 mL | Freq: Once | INTRAVENOUS | Status: AC | PRN
  Administered 2023-03-26: 75 mL via INTRAVENOUS

## 2023-03-26 MED ORDER — LACTATED RINGERS IV BOLUS
1000.0000 mL | Freq: Once | INTRAVENOUS | Status: AC
  Administered 2023-03-26: 1000 mL via INTRAVENOUS

## 2023-03-26 MED ORDER — ACETAMINOPHEN 500 MG PO TABS
1000.0000 mg | ORAL_TABLET | Freq: Once | ORAL | Status: AC
  Administered 2023-03-26: 1000 mg via ORAL
  Filled 2023-03-26: qty 2

## 2023-03-26 NOTE — ED Provider Notes (Addendum)
[place] [facility] Provider Note   CSN: [id number] Arrival date & time: 03/26/23  1529     History Chief Complaint  Patient presents with   Loss of Consciousness   Fall    HPI Jessica Conway is a 57 y.o. female presenting for dry cough for 2 days and syncopal episode following severe coughing fit, resulting in a fall.  History of Present Illness: Dry cough started 2 days ago, following exposure to grandchildren with a cold. No sputum production. No associated chest pain. Experienced syncopal episode after severe coughing fit, resulting in fall in kitchen. Hit head during fall, causing initial localized pain at impact site, which has since subsided. Left ankle pain noted post-fall. No other injuries or symptoms reported following the fall.    Patient's recorded medical, surgical, social, medication list and allergies were reviewed in the Snapshot window as part of the initial history.   Review of Systems   Review of Systems  Constitutional:  Negative for chills and fever.  HENT:  Negative for ear pain and sore throat.   Eyes:  Negative for pain and visual disturbance.  Respiratory:  Positive for cough. Negative for shortness of breath.   Cardiovascular:  Negative for chest pain and palpitations.  Gastrointestinal:  Negative for abdominal pain and vomiting.  Genitourinary:  Negative for dysuria and hematuria.  Musculoskeletal:  Negative for arthralgias and back pain.  Skin:  Negative for color change and rash.  Neurological:  Positive for syncope. Negative for seizures.  All other systems reviewed and are negative.   Physical Exam Updated Vital Signs BP 129/67   Pulse 85   Temp 98.9 F (37.2 C) (Oral)   Resp 17   Ht 5\' 2"  (1.575 m)   Wt 65.8 kg   SpO2 99%   BMI 26.52 kg/m  Physical Exam Constitutional:      General: She is not in acute distress.    Appearance: She is not ill-appearing or toxic-appearing.  HENT:     Head:  Normocephalic and atraumatic.  Eyes:     Extraocular Movements: Extraocular movements intact.     Pupils: Pupils are equal, round, and reactive to light.  Cardiovascular:     Rate and Rhythm: Normal rate.  Pulmonary:     Effort: No respiratory distress.  Abdominal:     General: Abdomen is flat.  Musculoskeletal:        General: No swelling, deformity or signs of injury.     Cervical back: Normal range of motion. No rigidity.  Skin:    General: Skin is warm and dry.  Neurological:     General: No focal deficit present.     Mental Status: She is alert and oriented to person, place, and time.  Psychiatric:        Mood and Affect: Mood normal.      ED Course/ Medical Decision Making/ A&P    Procedures Procedures   Medications Ordered in ED Medications  acetaminophen (TYLENOL) tablet 1,000 mg (has no administration in time range)  oxyCODONE (Oxy IR/ROXICODONE) immediate release tablet 5 mg (has no administration in time range)  lactated ringers bolus 1,000 mL (0 mLs Intravenous Stopped 03/26/23 1748)  iohexol (OMNIPAQUE) 350 MG/ML injection 75 mL (75 mLs Intravenous Contrast Given 03/26/23 1729)    Medical Decision Making:   Jessica Conway is a 57 y.o. female who presented to the ED today with a syncopal episode detailed above.    Additional history discussed with  patient's family/caregivers.  Patient placed on continuous vitals and telemetry monitoring while in ED which was reviewed periodically.  Complete initial physical exam performed, notably the patient  was HDS in NAD.    Reviewed and confirmed nursing documentation for past medical history, family history, social history.    Initial Assessment:   With the patient's presentation of syncope, most likely diagnosis is orthostatic hypotension vs vasovagal episode. Other diagnoses were considered including (but not limited to) arrythmogenic syncope, valvular abnormality, PE, aortic dissection. These are considered less  likely due to history of present illness and physical exam findings.   This is most consistent with an acute life/limb threatening illness complicated by underlying chronic conditions. In particular, concerning cardiac etiology, this is less likely to be the etiology given the lack of chest pain, lack of serious comorbidities including heart failure or CAD.   Additionally, patient's history appears more consistent with benign episodes including orthostatic event vs  vagal episode based on prodrome and coughing up.Marland Kitchen  FAINT score can be utilized in syncope to predict cardiac event in the appropriate patient. Utilization of BNP/Troponin testing can predict cardiac events within 30 days.   This patient is a candidate for a faint score as she has a baseline low pretest probability for cardiac etiology. Will proceed with BNP/Troponin testing as below. If negative, then patient without risk factor for acute cardiac event causing her syncope.   Initial Plan:  D-dimer to rule out pulmonary embolism Screening labs including CBC and Metabolic panel to evaluate for infectious or metabolic etiology of disease.  "Urinalysis with reflex culture ordered to evaluate for UTI or relevant urologic/nephrologic pathology.  CXR to evaluate for structural/infectious intrathoracic pathology.  Troponin/BNP/EKG to evaluate for cardiac pathology. Utilization of FAINT scoring detailed above.  Objective evaluation as below reviewed after administration of IVF/Telemetry monitoring  Initial Study Results:   Laboratory  All laboratory results reviewed without evidence of clinically relevant pathology.   Exceptions include: Positive D-dimer  EKG EKG was reviewed independently. Rate, rhythm, axis, intervals all examined and without medically relevant abnormality. ST segments without concerns for elevations.    Radiology:  All images reviewed independently. Agree with radiology report at this time.   CT Angio Chest Pulmonary  Embolism (PE) W or WO Contrast  Result Date: 03/26/2023 CLINICAL DATA:  Cough with syncope. History of sarcoidosis and anal carcinoma. EXAM: CT ANGIOGRAPHY CHEST WITH CONTRAST TECHNIQUE: Multidetector CT imaging of the chest was performed using the standard protocol during bolus administration of intravenous contrast. Multiplanar CT image reconstructions and MIPs were obtained to evaluate the vascular anatomy. RADIATION DOSE REDUCTION: This exam was performed according to the departmental dose-optimization program which includes automated exposure control, adjustment of the mA and/or kV according to patient size and/or use of iterative reconstruction technique. CONTRAST:  75mL OMNIPAQUE IOHEXOL 350 MG/ML SOLN COMPARISON:  Chest x-ray from same day. CT chest dated June 23, 2022. FINDINGS: Cardiovascular: Satisfactory opacification of the pulmonary arteries to the segmental level. No evidence of pulmonary embolism. Normal heart size. No pericardial effusion. No thoracic aortic aneurysm or dissection. 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 Mediastinum/Nodes: Unchanged prominent calcified mediastinal and bilateral hilar lymph nodes, consistent with known sarcoidosis. No enlarged axillary lymph nodes. Thyroid gland, trachea, and esophagus demonstrate no significant findings. Lungs/Pleura: No focal consolidation, pleural effusion, or pneumothorax. Unchanged 5 mm nodule in the right middle lobe (series 303, image 67), benign. No follow-up imaging is recommended. Upper Abdomen: No acute abnormality.  Unchanged gallstones.  Musculoskeletal: No chest wall abnormality. No acute or significant osseous findings. Review of the MIP images confirms the above findings. IMPRESSION: 1. No evidence of pulmonary embolism or other acute intrathoracic process. 2. Unchanged prominent calcified mediastinal and bilateral hilar lymph nodes, consistent with known sarcoidosis. 3. Unchanged cholelithiasis.  Electronically Signed   By: Obie Dredge M.D.   On: 03/26/2023 18:31   CT Head Wo Contrast  Result Date: 03/26/2023 CLINICAL DATA:  Syncope and presyncope. EXAM: CT HEAD WITHOUT CONTRAST TECHNIQUE: Contiguous axial images were obtained from the base of the skull through the vertex without intravenous contrast. RADIATION DOSE REDUCTION: This exam was performed according to the departmental dose-optimization program which includes automated exposure control, adjustment of the mA and/or kV according to patient size and/or use of iterative reconstruction technique. COMPARISON:  Head CT 05/09/2021. FINDINGS: Brain: No evidence of acute infarction, hemorrhage, hydrocephalus, extra-axial collection or mass lesion/mass effect. Vascular: Atherosclerotic calcifications are present within the cavernous internal carotid arteries. Skull: Normal. Negative for fracture or focal lesion. Sinuses/Orbits: No acute finding. Other: None. IMPRESSION: No acute intracranial abnormality. Electronically Signed   By: Darliss Cheney M.D.   On: 03/26/2023 17:51   DG Chest Portable 1 View  Result Date: 03/26/2023 CLINICAL DATA:  Syncope. EXAM: PORTABLE CHEST 1 VIEW COMPARISON:  05/09/2021. FINDINGS: Redemonstration of extensive bilateral hilar calcifications, which corresponds to calcified mediastinal lymphadenopathy on the prior CT scan from 06/23/2022. Bilateral lung fields are otherwise clear. Bilateral costophrenic angles are clear. Normal cardio-mediastinal silhouette. No acute osseous abnormalities. The soft tissues are within normal limits. IMPRESSION: *No active disease. Electronically Signed   By: Jules Schick M.D.   On: 03/26/2023 17:25   DG Ankle Complete Left  Result Date: 03/26/2023 CLINICAL DATA:  Left ankle injury.  Left ankle pain. EXAM: LEFT ANKLE COMPLETE - 3+ VIEW COMPARISON:  None Available. FINDINGS: The ankle mortise is symmetric and intact. There is oblique longitudinal linear lucency within the distal  fibular diaphysis and metaphysis indicating an acute nondisplaced fracture. Moderate lateral malleolar soft tissue swelling. Minimal chronic enthesopathic change at the Achilles insertion on the calcaneus. Mild degenerative spurring at the distal anterior tibial plafond. IMPRESSION: Acute nondisplaced oblique longitudinal fracture of the distal fibular diaphysis and metaphysis. Electronically Signed   By: Neita Garnet M.D.   On: 03/26/2023 17:18      Final Assessment and Plan:   Patient's history of present illness and physical exam findings are not consistent with any acute pathology at this time.  CT scans without focal pathology.  The only sporadic injury is a left ankle fracture.  Placed in a cam boot with crutches for support.  Will refer to orthopedic in the outpatient setting. Tylenol oxycodone as needed.  Heart rate returned to normal after IV fluids.  Disposition:  I have considered need for hospitalization, however, considering all of the above, I believe this patient is stable for discharge at this time.  Patient/family educated about specific return precautions for given chief complaint and symptoms.  Patient/family educated about follow-up with PCP.     Patient/family expressed understanding of return precautions and need for follow-up. Patient spoken to regarding all imaging and laboratory results and appropriate follow up for these results. All education provided in verbal form with additional information in written form. Time was allowed for answering of patient questions. Patient discharged.    Emergency Department Medication Summary:   Medications  acetaminophen (TYLENOL) tablet 1,000 mg (has no administration in time range)  oxyCODONE (Oxy IR/ROXICODONE)  immediate release tablet 5 mg (has no administration in time range)  lactated ringers bolus 1,000 mL (0 mLs Intravenous Stopped 03/26/23 1748)  iohexol (OMNIPAQUE) 350 MG/ML injection 75 mL (75 mLs Intravenous Contrast Given  03/26/23 1729)     Clinical Impression:  1. Syncope and collapse      Discharge   Final Clinical Impression(s) / ED Diagnoses Final diagnoses:  Syncope and collapse    Rx / DC Orders ED Discharge Orders     None         Glyn Ade, MD 03/26/23 Luiz Iron    Glyn Ade, MD 03/26/23 [phone]

## 2023-03-26 NOTE — ED Triage Notes (Signed)
Pt reports she remembers coughing and then she woke up on the floor; she c/o pain to LT ankle and posterior head; sts she was feeling normal before this
